# Patient Record
Sex: Male | Born: 1980 | Race: Black or African American | Hispanic: No | Marital: Married | Smoking: Never smoker
Health system: Southern US, Community
[De-identification: ages and names within clinical notes are randomized; demographics above are authoritative.]

## PROBLEM LIST (undated history)

## (undated) DIAGNOSIS — J45909 Unspecified asthma, uncomplicated: Secondary | ICD-10-CM

## (undated) DIAGNOSIS — R569 Unspecified convulsions: Secondary | ICD-10-CM

## (undated) HISTORY — PX: SHOULDER SURGERY: SHX246

---

## 2002-05-23 ENCOUNTER — Ambulatory Visit (HOSPITAL_BASED_OUTPATIENT_CLINIC_OR_DEPARTMENT_OTHER): Admission: RE | Admit: 2002-05-23 | Discharge: 2002-05-24 | Payer: Self-pay | Admitting: Orthopedic Surgery

## 2016-09-28 ENCOUNTER — Emergency Department
Admission: EM | Admit: 2016-09-28 | Discharge: 2016-09-28 | Disposition: A | Discharge status: Home / Self Care | Payer: 59 | Source: Home / Self Care | Attending: Family Medicine | Admitting: Family Medicine

## 2016-09-28 ENCOUNTER — Encounter: Payer: Self-pay | Admitting: *Deleted

## 2016-09-28 DIAGNOSIS — J208 Acute bronchitis due to other specified organisms: Secondary | ICD-10-CM

## 2016-09-28 HISTORY — DX: Unspecified convulsions: R56.9

## 2016-09-28 HISTORY — DX: Unspecified asthma, uncomplicated: J45.909

## 2016-09-28 MED ORDER — AZITHROMYCIN 250 MG PO TABS: 250.0000 mg | ORAL_TABLET | Freq: Every day | ORAL | 0 refills | Status: DC

## 2016-09-28 NOTE — ED Triage Notes (Signed)
Pt c/o nonproductive cough x 6 days. Denies fever.

## 2016-09-28 NOTE — ED Provider Notes (Signed)
CSN: [id number]     Arrival date & time 09/28/16  1607 History   First MD Initiated Contact with Patient 09/28/16 1706     Chief Complaint  Patient presents with  . Cough   (Consider location/radiation/quality/duration/timing/severity/associated sxs/prior Treatment) The history is provided by the patient. No language interpreter was used.  Cough  Cough characteristics:  Non-productive Sputum characteristics:  Nondescript Severity:  Moderate Onset quality:  Gradual Duration:  6 days Timing:  Constant Progression:  Worsening Chronicity:  New Smoker: no   Context: not smoke exposure   Relieved by:  Nothing Worsened by:  Nothing Ineffective treatments:  None tried Associated symptoms: no fever and no shortness of breath   Risk factors: no recent infection   Pt complains of a cough for 6 days.  Pt denies smoking  Past Medical History:  Diagnosis Date  . Asthma   . Seizures (HCC)    Past Surgical History:  Procedure Laterality Date  . SHOULDER SURGERY     Family History  Problem Relation Age of Onset  . Cancer Mother     breast   Social History  Substance Use Topics  . Smoking status: Never Smoker  . Smokeless tobacco: Never Used  . Alcohol use No    Review of Systems  Constitutional: Negative for fever.  Respiratory: Positive for cough. Negative for shortness of breath.   All other systems reviewed and are negative.   Allergies  Review of patient's allergies indicates no known allergies.  Home Medications   Prior to Admission medications   Medication Sig Start Date End Date Taking? Authorizing Provider  UNKNOWN TO PATIENT    Yes Historical Provider, MD  azithromycin (ZITHROMAX) 250 MG tablet Take 1 tablet (250 mg total) by mouth daily. Take first 2 tablets together, then 1 every day until finished. 09/28/16   Elson AreasLeslie K Griffon Herberg, PA-C   Meds Ordered and Administered this Visit  Medications - No data to display  BP (!) 154/105 (BP Location: Left Arm)   Pulse 83    Temp 98.3 F (36.8 C) (Oral)   Resp 16   Ht 6\' 1"  (1.854 m)   Wt 224 lb (101.6 kg)   SpO2 99%   BMI 29.55 kg/m  No data found.   Physical Exam  Constitutional: He appears well-developed and well-nourished.  HENT:  Head: Normocephalic and atraumatic.  Right Ear: External ear normal.  Left Ear: External ear normal.  Mouth/Throat: Oropharynx is clear and moist.  Eyes: Conjunctivae are normal.  Neck: Neck supple.  Cardiovascular: Normal rate and regular rhythm.   No murmur heard. Pulmonary/Chest: Effort normal and breath sounds normal. No respiratory distress.  Abdominal: Soft. There is no tenderness.  Musculoskeletal: He exhibits no edema.  Neurological: He is alert.  Skin: Skin is warm and dry.  Psychiatric: He has a normal mood and affect.  Nursing note and vitals reviewed.   Urgent Care Course   Clinical Course    Procedures (including critical care time)  Labs Review Labs Reviewed - No data to display  Imaging Review No results found.   Visual Acuity Review  Right Eye Distance:   Left Eye Distance:   Bilateral Distance:    Right Eye Near:   Left Eye Near:    Bilateral Near:         MDM   1. Acute bronchitis due to other specified organisms    An After Visit Summary was printed and given to the patient. Meds ordered this encounter  Medications  . UNKNOWN TO PATIENT  . azithromycin (ZITHROMAX) 250 MG tablet    Sig: Take 1 tablet (250 mg total) by mouth daily. Take first 2 tablets together, then 1 every day until finished.    Dispense:  6 tablet    Refill:  0    Order Specific Question:   Supervising Provider    Answer:   Georgina PillionMASSEY, DAVID [[id number]]     Elson AreasLeslie K Oney Folz, PA-C 09/28/16 1953

## 2017-01-08 ENCOUNTER — Emergency Department
Admission: EM | Admit: 2017-01-08 | Discharge: 2017-01-08 | Disposition: A | Discharge status: Home / Self Care | Payer: 59 | Source: Home / Self Care | Attending: Family Medicine | Admitting: Family Medicine

## 2017-01-08 DIAGNOSIS — B9789 Other viral agents as the cause of diseases classified elsewhere: Secondary | ICD-10-CM | POA: Diagnosis not present

## 2017-01-08 DIAGNOSIS — J069 Acute upper respiratory infection, unspecified: Secondary | ICD-10-CM

## 2017-01-08 DIAGNOSIS — J9801 Acute bronchospasm: Secondary | ICD-10-CM

## 2017-01-08 MED ORDER — BENZONATATE 200 MG PO CAPS: ORAL_CAPSULE | ORAL | 0 refills | Status: DC

## 2017-01-08 MED ORDER — PREDNISONE 20 MG PO TABS: ORAL_TABLET | ORAL | 0 refills | Status: DC

## 2017-01-08 MED ORDER — DOXYCYCLINE HYCLATE 100 MG PO CAPS: 100.0000 mg | ORAL_CAPSULE | Freq: Two times a day (BID) | ORAL | 0 refills | Status: DC

## 2017-01-08 NOTE — Discharge Instructions (Signed)
Take plain guaifenesin (1200mg extended release tabs such as Mucinex) twice daily, with plenty of water, for cough and congestion.  May add Pseudoephedrine (30mg, one or two every 4 to 6 hours) for sinus congestion.  Get adequate rest.   °May use Afrin nasal spray (or generic oxymetazoline) twice daily for about 5 days and then discontinue.  Also recommend using saline nasal spray several times daily and saline nasal irrigation (AYR is a common brand).  Use Flonase nasal spray each morning after using Afrin nasal spray and saline nasal irrigation. °Try warm salt water gargles for sore throat.  °Stop all antihistamines for now, and other non-prescription cough/cold preparations. °Continue albuterol inhaler as needed. °  °  °

## 2017-01-08 NOTE — ED Triage Notes (Signed)
Started with sinus pain, congestion, runny nose.  Started coughing up mucous yesterday, yellowish in color.  Coughs to the point of vomiting.

## 2017-01-08 NOTE — ED Provider Notes (Signed)
[facility]    CSN: [id number] Arrival date & time: 01/08/17  1859     History   Chief Complaint Chief Complaint  Patient presents with  . Cough  . Nasal Congestion    HPI Cameron Marks is a 36 y.o. male.   Patient complains of four day history of typical cold-like symptoms developing over several days, including mild sore throat, sinus congestion, fatigue, and cough.  He has a past history of exercise asthma as a child and notes that viral URI's always linger.  He has an albuterol inhaler at home prescribed when he had a URI.   The history is provided by the patient.    Past Medical History:  Diagnosis Date  . Asthma   . Seizures (HCC)     There are no active problems to display for this patient.   Past Surgical History:  Procedure Laterality Date  . SHOULDER SURGERY         Home Medications    Prior to Admission medications   Medication Sig Start Date End Date Taking? Authorizing Provider  carbamazepine (TEGRETOL) 200 MG tablet Take 200 mg by mouth 3 (three) times daily.   Yes Historical Provider, MD  benzonatate (TESSALON) 200 MG capsule Take one cap by mouth at bedtime as needed for cough.  May repeat in 4 to 6 hours 01/08/17   Lattie HawStephen A Beese, MD  doxycycline (VIBRAMYCIN) 100 MG capsule Take 1 capsule (100 mg total) by mouth 2 (two) times daily. Take with food. 01/08/17   Lattie HawStephen A Beese, MD  predniSONE (DELTASONE) 20 MG tablet Take one tab by mouth twice daily for 5 days, then one daily. Take with food. 01/08/17   Lattie HawStephen A Beese, MD  UNKNOWN TO PATIENT     Historical Provider, MD    Family History Family History  Problem Relation Age of Onset  . Cancer Mother     breast    Social History Social History  Substance Use Topics  . Smoking status: Never Smoker  . Smokeless tobacco: Never Used  . Alcohol use No     Allergies   Patient has no known allergies.   Review of Systems Review of Systems + sore throat + cough No  pleuritic pain ? wheezing + nasal congestion + post-nasal drainage No sinus pain/pressure No itchy/red eyes No earache No hemoptysis No SOB No fever, + chills No nausea No vomiting No abdominal pain No diarrhea No urinary symptoms No skin rash + fatigue No myalgias No headache Used OTC meds without relief   Physical Exam Triage Vital Signs ED Triage Vitals  Enc Vitals Group     BP 01/08/17 1921 129/87     Pulse Rate 01/08/17 1921 78     Resp --      Temp 01/08/17 1921 98 F (36.7 C)     Temp Source 01/08/17 1921 Oral     SpO2 01/08/17 1921 96 %     Weight 01/08/17 1921 225 lb (102.1 kg)     Height 01/08/17 1921 6' (1.829 m)     Head Circumference --      Peak Flow --      Pain Score 01/08/17 1923 0     Pain Loc --      Pain Edu? --      Excl. in GC? --    No data found.   Updated Vital Signs BP 129/87 (BP Location: Left Arm)   Pulse 78   Temp 98  F (36.7 C) (Oral)   Ht 6' (1.829 m)   Wt 225 lb (102.1 kg)   SpO2 96%   BMI 30.52 kg/m   Visual Acuity Right Eye Distance:   Left Eye Distance:   Bilateral Distance:    Right Eye Near:   Left Eye Near:    Bilateral Near:     Physical Exam Nursing notes and Vital Signs reviewed. Appearance:  Patient appears stated age, and in no acute distress Eyes:  Pupils are equal, round, and reactive to light and accomodation.  Extraocular movement is intact.  Conjunctivae are not inflamed  Ears:  Canals normal.  Tympanic membranes normal, but may have some serous effusion bilaterally.  Nose:  Congested turbinates.  No sinus tenderness.    Pharynx:  Normal Neck:  Supple.  Tender enlarged posterior/lateral nodes are palpated bilaterally  Lungs:  Course breath sounds posteriorly.  Breath sounds are equal.  Moving air well. Heart:  Regular rate and rhythm without murmurs, rubs, or gallops.  Abdomen:  Nontender without masses or hepatosplenomegaly.  Bowel sounds are present.  No CVA or flank tenderness.  Extremities:   No edema.  Skin:  No rash present.    UC Treatments / Results  Labs (all labs ordered are listed, but only abnormal results are displayed) Labs Reviewed - No data to display  EKG  EKG Interpretation None       Radiology No results found.  Procedures Procedures (including critical care time)  Medications Ordered in UC Medications - No data to display   Initial Impression / Assessment and Plan / UC Course  I have reviewed the triage vital signs and the nursing notes.  Pertinent labs & imaging results that were available during my care of the patient were reviewed by me and considered in my medical decision making (see chart for details).    With patient's history of past asthma, will benefit from prednisone. Begin prednisone burst/taper, and doxycycline 100mg  bid for atypical coverage. Prescription written for Benzonatate [facility]) to take at bedtime for night-time cough.  Take plain guaifenesin (1200mg  extended release tabs such as Mucinex) twice daily, with plenty of water, for cough and congestion.  May add Pseudoephedrine (30mg , one or two every 4 to 6 hours) for sinus congestion.  Get adequate rest.   May use Afrin nasal spray (or generic oxymetazoline) twice daily for about 5 days and then discontinue.  Also recommend using saline nasal spray several times daily and saline nasal irrigation (AYR is a common brand).  Use Flonase nasal spray each morning after using Afrin nasal spray and saline nasal irrigation. Try warm salt water gargles for sore throat.  Stop all antihistamines for now, and other non-prescription cough/cold preparations. Continue albuterol inhaler as needed. Followup with Family Doctor if not improved in one week.      Final Clinical Impressions(s) / UC Diagnoses   Final diagnoses:  Viral URI with cough  Bronchospasm, acute    New Prescriptions New Prescriptions   BENZONATATE (TESSALON) 200 MG CAPSULE    Take one cap by mouth at bedtime as  needed for cough.  May repeat in 4 to 6 hours   DOXYCYCLINE (VIBRAMYCIN) 100 MG CAPSULE    Take 1 capsule (100 mg total) by mouth 2 (two) times daily. Take with food.   PREDNISONE (DELTASONE) 20 MG TABLET    Take one tab by mouth twice daily for 5 days, then one daily. Take with food.     Lattie Haw, MD  01/08/17 2025  

## 2017-10-01 ENCOUNTER — Emergency Department
Admission: EM | Admit: 2017-10-01 | Discharge: 2017-10-01 | Disposition: A | Discharge status: Home / Self Care | Payer: 59 | Source: Home / Self Care | Attending: Emergency Medicine | Admitting: Emergency Medicine

## 2017-10-01 ENCOUNTER — Encounter: Payer: Self-pay | Admitting: Emergency Medicine

## 2017-10-01 DIAGNOSIS — J209 Acute bronchitis, unspecified: Secondary | ICD-10-CM | POA: Diagnosis not present

## 2017-10-01 DIAGNOSIS — J4520 Mild intermittent asthma, uncomplicated: Secondary | ICD-10-CM

## 2017-10-01 MED ORDER — BENZONATATE 100 MG PO CAPS: 100.0000 mg | ORAL_CAPSULE | Freq: Three times a day (TID) | ORAL | 0 refills | Status: DC | PRN

## 2017-10-01 MED ORDER — ALBUTEROL SULFATE HFA 108 (90 BASE) MCG/ACT IN AERS: 1.0000 | INHALATION_SPRAY | Freq: Four times a day (QID) | RESPIRATORY_TRACT | 0 refills | Status: AC | PRN

## 2017-10-01 MED ORDER — IPRATROPIUM-ALBUTEROL 0.5-2.5 (3) MG/3ML IN SOLN
3.0000 mL | Freq: Once | RESPIRATORY_TRACT | Status: AC
  Administered 2017-10-01: 3 mL via RESPIRATORY_TRACT

## 2017-10-01 MED ORDER — AZITHROMYCIN 250 MG PO TABS: ORAL_TABLET | ORAL | 0 refills | Status: DC

## 2017-10-01 NOTE — ED Provider Notes (Signed)
[facility]    CSN: [id number] Arrival date & time: 10/01/17  [id number]     History   Chief Complaint Chief Complaint  Patient presents with  . Cough  Patient states he has been sick the last 3-4 days. Of note he was ill about 2 weeks ago with head and nasal congestion. This resolved with over-the-counter medication after 3-4 days. His most recent illness began a couple of days ago. He started with a dry cough but now has a somewhat productive cough coughing up yellowish phlegm. He has no colored nasal congestion. He has no fever or aching. I note he does have a history of asthma as a child and does keep an inhaler at home.  HPI Cameron Marks is a 36 y.o. male.   HPI  Past Medical History:  Diagnosis Date  . Asthma   . Seizures (HCC)     There are no active problems to display for this patient.   Past Surgical History:  Procedure Laterality Date  . SHOULDER SURGERY         Home Medications    Prior to Admission medications   Medication Sig Start Date End Date Taking? Authorizing Provider  albuterol (PROVENTIL HFA;VENTOLIN HFA) 108 (90 Base) MCG/ACT inhaler Inhale 1-2 puffs every 6 (six) hours as needed into the lungs for wheezing or shortness of breath. 10/01/17   Collene Gobbleaub, Minaal Struckman A, MD  azithromycin (ZITHROMAX) 250 MG tablet Take 2 tabs PO x 1 dose, then 1 tab PO QD x 4 days 10/01/17   Collene Gobbleaub, Shanise Balch A, MD  benzonatate (TESSALON) 100 MG capsule Take 1-2 capsules (100-200 mg total) 3 (three) times daily as needed by mouth for cough. 10/01/17   Collene Gobbleaub, Alira Fretwell A, MD    Family History Family History  Problem Relation Age of Onset  . Cancer Mother        breast    Social History Social History   Tobacco Use  . Smoking status: Never Smoker  . Smokeless tobacco: Never Used  Substance Use Topics  . Alcohol use: No  . Drug use: No     Allergies   Patient has no known allergies.   Review of Systems Review of Systems  Constitutional: Positive for appetite  change. Negative for fever.  HENT: Positive for congestion and postnasal drip.   Respiratory: Positive for cough. Negative for wheezing.      Physical Exam Triage Vital Signs ED Triage Vitals  Enc Vitals Group     BP 10/01/17 0846 110/79     Pulse Rate 10/01/17 0846 90     Resp 10/01/17 0846 16     Temp 10/01/17 0846 98.8 F (37.1 C)     Temp Source 10/01/17 0846 Oral     SpO2 10/01/17 0846 99 %     Weight 10/01/17 0847 216 lb (98 kg)     Height 10/01/17 0847 6\' 1"  (1.854 m)     Head Circumference --      Peak Flow --      Pain Score 10/01/17 0847 0     Pain Loc --      Pain Edu? --      Excl. in GC? --    No data found.  Updated Vital Signs BP 110/79 (BP Location: Left Arm)   Pulse 90   Temp 98.8 F (37.1 C) (Oral)   Resp 16   Ht 6\' 1"  (1.854 m)   Wt 216 lb (98 kg)   SpO2 99%  BMI 28.50 kg/m   Visual Acuity Right Eye Distance:   Left Eye Distance:   Bilateral Distance:    Right Eye Near:   Left Eye Near:    Bilateral Near:     Physical Exam  Constitutional: He appears well-developed and well-nourished.  HENT:  Head: Normocephalic.  Right Ear: External ear normal.  Left Ear: External ear normal.  Mouth/Throat: Oropharynx is clear and moist.  Eyes: EOM are normal. Pupils are equal, round, and reactive to light.  Pulmonary/Chest: He has wheezes.  There are some mild rhonchi right lateral lung. Breath sounds are symmetrical. There is good air exchange.     UC Treatments / Results  Labs (all labs ordered are listed, but only abnormal results are displayed) Labs Reviewed - No data to display  EKG  EKG Interpretation None       Radiology No results found.  Procedures Procedures (including critical care time)  Medications Ordered in UC Medications  ipratropium-albuterol (DUONEB) 0.5-2.5 (3) MG/3ML nebulizer solution 3 mL (3 mLs Nebulization Given 10/01/17 0900)     Initial Impression / Assessment and Plan / UC Course  I have reviewed the  triage vital signs and the nursing notes.  Pertinent labs & imaging results that were available during my care of the patient were reviewed by me and considered in my medical decision making (see chart for details). Patient did seem to improve with his nebulizer treatment. Will treat with Mucinex, albuterol inhaler as well as a Z-Pak.    Final Clinical Impressions(s) / UC Diagnoses   Final diagnoses:  Acute bronchitis, unspecified organism  Mild intermittent reactive airway disease without complication    New Prescriptions This SmartLink is deprecated. Use AVSMEDLIST instead to display the medication list for a patient.   Controlled Substance Prescriptions [place] Controlled Substance Registry consulted? Not Applicable   Collene Gobble, MD 10/01/17 [phone]

## 2017-10-01 NOTE — ED Triage Notes (Signed)
Pt reports that he had a URI that began 2wks ago that was resolving, then he developed a nonproductive cough, nasal congestion and sinus pressure x 4 days. Denies fever.

## 2018-11-28 ENCOUNTER — Encounter: Payer: Self-pay | Admitting: Emergency Medicine

## 2018-11-28 ENCOUNTER — Emergency Department (INDEPENDENT_AMBULATORY_CARE_PROVIDER_SITE_OTHER): Payer: 59

## 2018-11-28 ENCOUNTER — Emergency Department (INDEPENDENT_AMBULATORY_CARE_PROVIDER_SITE_OTHER)
Admission: EM | Admit: 2018-11-28 | Discharge: 2018-11-28 | Disposition: A | Discharge status: Home / Self Care | Payer: 59 | Source: Home / Self Care | Attending: Family Medicine | Admitting: Family Medicine

## 2018-11-28 DIAGNOSIS — M25572 Pain in left ankle and joints of left foot: Secondary | ICD-10-CM

## 2018-11-28 DIAGNOSIS — S93402A Sprain of unspecified ligament of left ankle, initial encounter: Secondary | ICD-10-CM

## 2018-11-28 DIAGNOSIS — R030 Elevated blood-pressure reading, without diagnosis of hypertension: Secondary | ICD-10-CM

## 2018-11-28 NOTE — ED Triage Notes (Signed)
Pt states he was jumping on trampoline Sunday and his left ankle started hurting. Has not improved. Denies swelling or bruising. using ice and motrin.

## 2018-11-28 NOTE — ED Provider Notes (Signed)
[facility]    CSN: [id number] Arrival date & time: 11/28/18  1033     History   Chief Complaint Chief Complaint  Patient presents with  . Ankle Pain    HPI [facility] is a 38 y.o. male.   HPI  Cameron Marks is a 38 y.o. male presenting to UC with c/o Left ankle pain for 5 days after jumping on a trampoline. He has tried ice and motrin with mild relief. Pain is worse with walking. Denies swelling or bruising. No hx of prior injury to same ankle.   BP elevated. Pt states it is only high at the doctor's office.   Past Medical History:  Diagnosis Date  . Asthma   . Seizures (HCC)     There are no active problems to display for this patient.   Past Surgical History:  Procedure Laterality Date  . SHOULDER SURGERY         Home Medications    Prior to Admission medications   Medication Sig Start Date End Date Taking? Authorizing Provider  albuterol (PROVENTIL HFA;VENTOLIN HFA) 108 (90 Base) MCG/ACT inhaler Inhale 1-2 puffs every 6 (six) hours as needed into the lungs for wheezing or shortness of breath. 10/01/17   Collene Gobble, MD  azithromycin (ZITHROMAX) 250 MG tablet Take 2 tabs PO x 1 dose, then 1 tab PO QD x 4 days 10/01/17   Collene Gobble, MD  benzonatate (TESSALON) 100 MG capsule Take 1-2 capsules (100-200 mg total) 3 (three) times daily as needed by mouth for cough. 10/01/17   Collene Gobble, MD    Family History Family History  Problem Relation Age of Onset  . Cancer Mother        breast    Social History Social History   Tobacco Use  . Smoking status: Never Smoker  . Smokeless tobacco: Never Used  Substance Use Topics  . Alcohol use: No  . Drug use: No     Allergies   Patient has no known allergies.   Review of Systems Review of Systems  Musculoskeletal: Positive for arthralgias. Negative for joint swelling and myalgias.  Skin: Negative for color change and wound.  Neurological: Negative for weakness and numbness.      Physical Exam Triage Vital Signs ED Triage Vitals [11/28/18 1104]  Enc Vitals Group     BP (!) 147/108     Pulse Rate 80     Resp      Temp 98 F (36.7 C)     Temp Source Oral     SpO2 98 %     Weight 223 lb (101.2 kg)     Height      Head Circumference      Peak Flow      Pain Score 6     Pain Loc      Pain Edu?      Excl. in GC?    No data found.  Updated Vital Signs BP (!) 152/89 (BP Location: Right Arm)   Pulse 80   Temp 98 F (36.7 C) (Oral)   Wt 223 lb (101.2 kg)   SpO2 98%   BMI 29.42 kg/m   Visual Acuity Right Eye Distance:   Left Eye Distance:   Bilateral Distance:    Right Eye Near:   Left Eye Near:    Bilateral Near:     Physical Exam Vitals signs and nursing note reviewed.  Constitutional:  Appearance: He is well-developed.  HENT:     Head: Normocephalic and atraumatic.  Neck:     Musculoskeletal: Normal range of motion.  Cardiovascular:     Rate and Rhythm: Normal rate.  Pulmonary:     Effort: Pulmonary effort is normal.  Musculoskeletal: Normal range of motion.        General: Tenderness present. No swelling.     Comments: Left ankle: no edema or deformity. Tenderness to dorsal lateral aspect. Full ROM. No crepitus. No foot tenderness.   Skin:    General: Skin is warm and dry.     Capillary Refill: Capillary refill takes less than 2 seconds.     Findings: No abrasion or bruising.  Neurological:     Mental Status: He is alert and oriented to person, place, and time.  Psychiatric:        Behavior: Behavior normal.      UC Treatments / Results  Labs (all labs ordered are listed, but only abnormal results are displayed) Labs Reviewed - No data to display  EKG None  Radiology Dg Ankle Complete Left  Result Date: 11/28/2018 CLINICAL DATA:  Left lateral ankle pain. Trampoline injury 5 days ago. EXAM: LEFT ANKLE COMPLETE - 3+ VIEW COMPARISON:  None. FINDINGS: There is no evidence of fracture, dislocation, or joint  effusion. There is no evidence of arthropathy or other focal bone abnormality. Soft tissues are unremarkable. IMPRESSION: Negative. Electronically Signed   By: Charlett NoseKevin  Dover M.D.   On: 11/28/2018 11:36    Procedures Procedures (including critical care time)  Medications Ordered in UC Medications - No data to display  Initial Impression / Assessment and Plan / UC Course  I have reviewed the triage vital signs and the nursing notes.  Pertinent labs & imaging results that were available during my care of the patient were reviewed by me and considered in my medical decision making (see chart for details).     Reviewed imaging  Hx and exam c/w mild ankle sprain Offered splint, pt declined. Pt states he has a wrap at home Home care info packet provided.  Encouraged to monitor his BP  Final Clinical Impressions(s) / UC Diagnoses   Final diagnoses:  Mild ankle sprain, left, initial encounter     Discharge Instructions      You may take 500mg  acetaminophen every 4-6 hours or in combination with ibuprofen 400-600mg  every 6-8 hours as needed for pain and inflammation.  Please call to schedule an appointment with sports medicine in 1 week if not improving.      ED Prescriptions    None     Controlled Substance Prescriptions [place] Controlled Substance Registry consulted? Not Applicable   Rolla Platehelps, Sherilee Smotherman O, PA-C 11/28/18 1152

## 2018-11-28 NOTE — Discharge Instructions (Signed)
°  You may take 500mg  acetaminophen every 4-6 hours or in combination with ibuprofen 400-600mg  every 6-8 hours as needed for pain and inflammation.  Please call to schedule an appointment with sports medicine in 1 week if not improving.

## 2019-10-12 IMAGING — DX DG ANKLE COMPLETE 3+V*L*
3 series · 3 of 3 positions shown · non-contrast
Comparison: None.

CLINICAL DATA: Left lateral ankle pain. Trampoline injury 5 days
ago.

EXAM:
LEFT ANKLE COMPLETE - 3+ VIEW

[ankle ap]
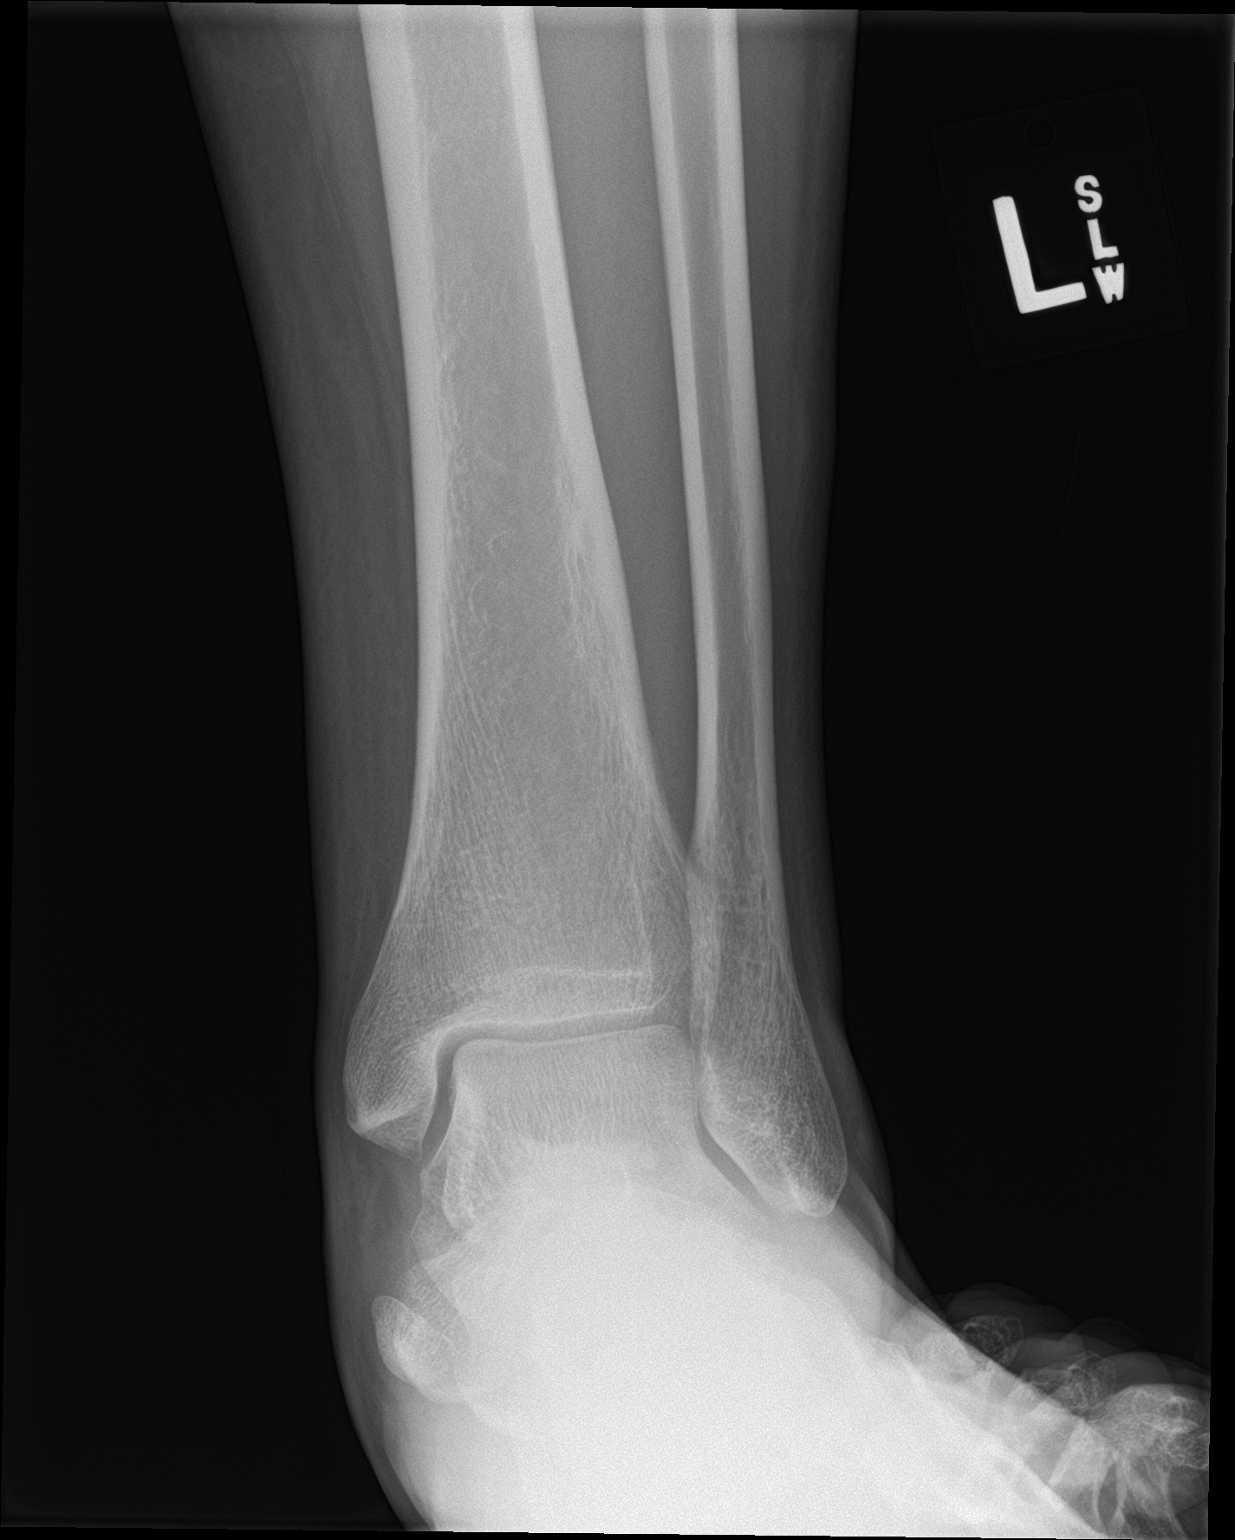

[ankle obl]
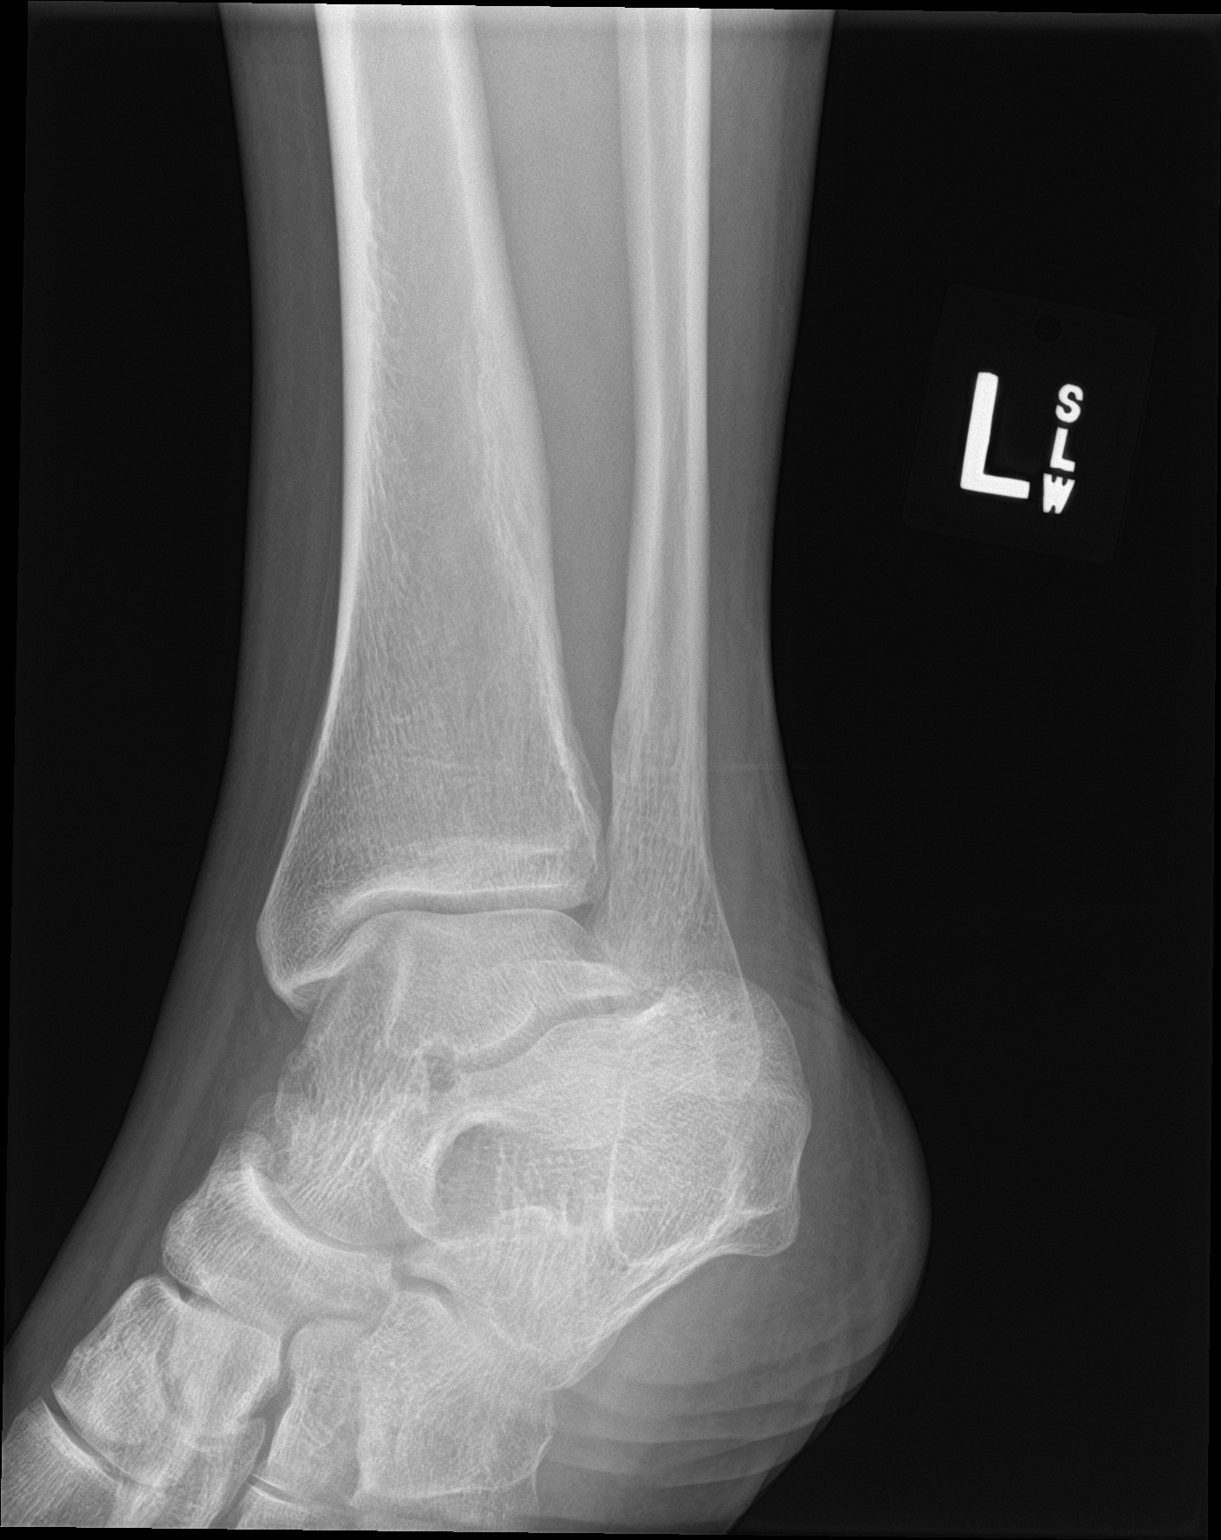

[ankle lat]
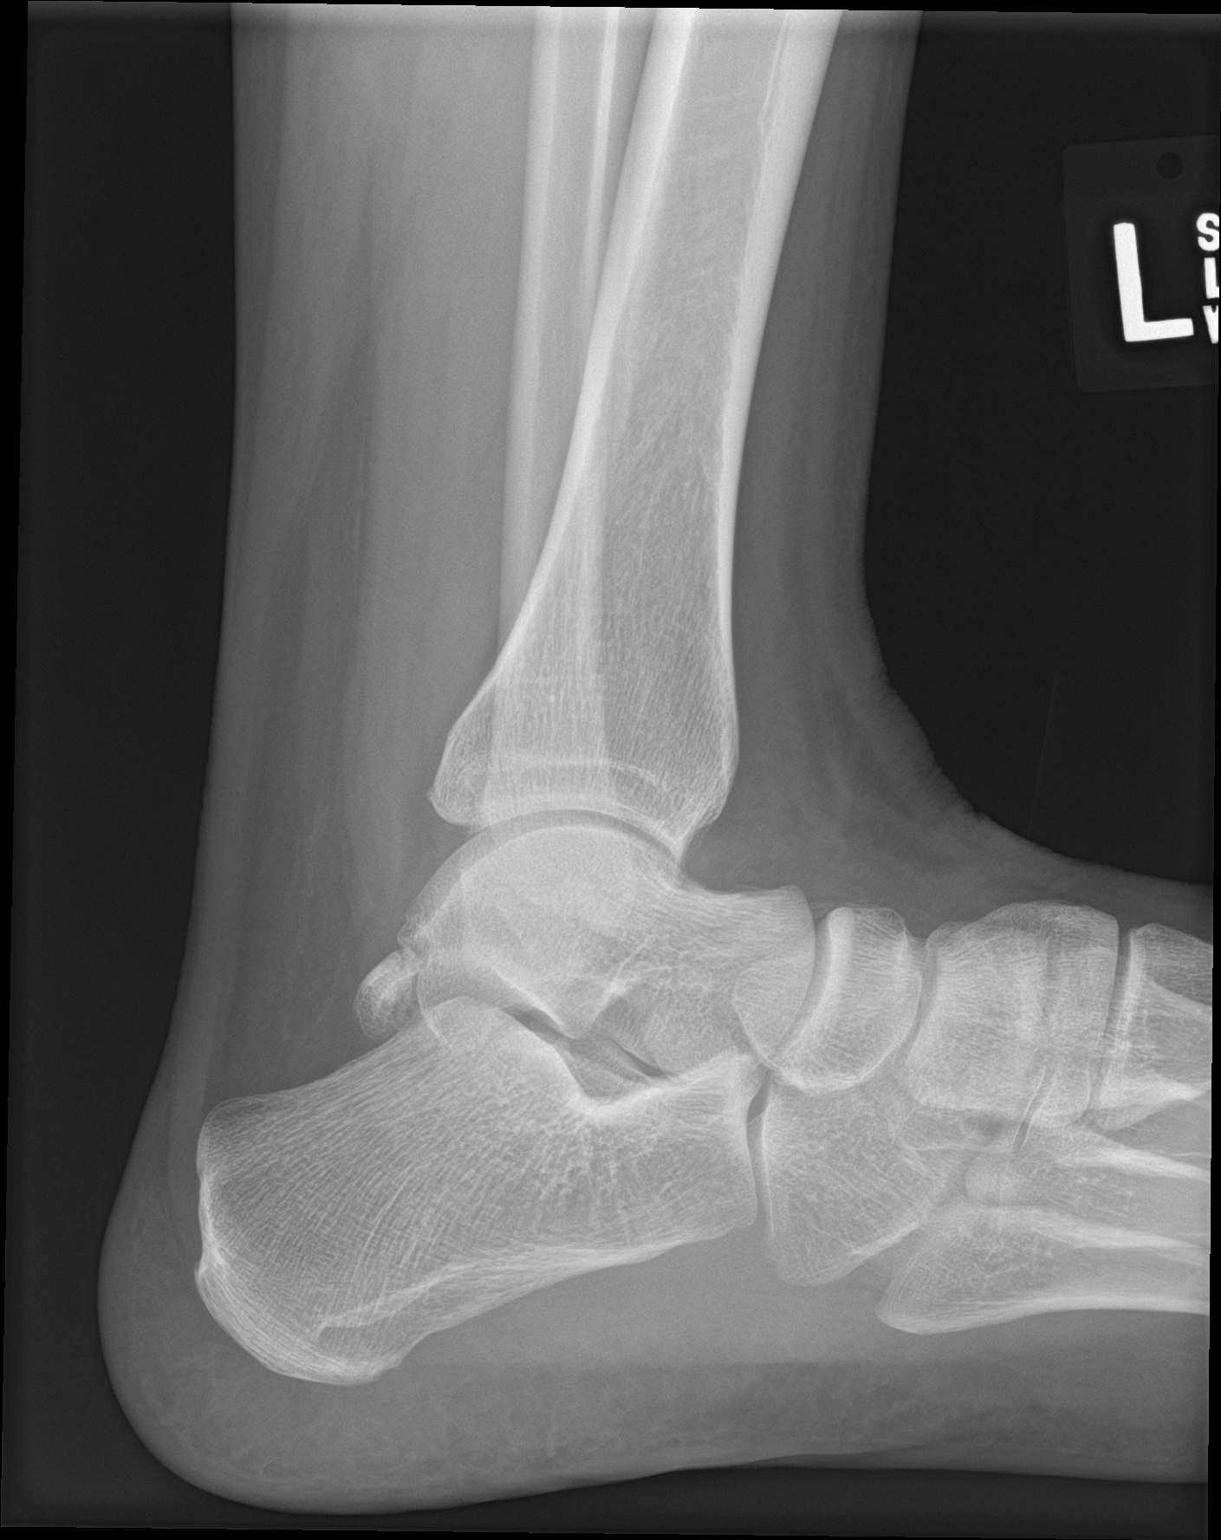

[3 of 3 positions shown; findings below may reference images not displayed]

FINDINGS: There is no evidence of fracture, dislocation, or joint effusion.
There is no evidence of arthropathy or other focal bone abnormality.
Soft tissues are unremarkable.
IMPRESSION: Negative.

## 2019-10-26 ENCOUNTER — Telehealth: Payer: 59 | Admitting: Family

## 2019-10-26 DIAGNOSIS — J069 Acute upper respiratory infection, unspecified: Secondary | ICD-10-CM | POA: Diagnosis not present

## 2019-10-26 MED ORDER — PROMETHAZINE-DM 6.25-15 MG/5ML PO SYRP: 5.0000 mL | ORAL_SOLUTION | Freq: Four times a day (QID) | ORAL | 0 refills | Status: DC | PRN

## 2019-10-26 MED ORDER — FLUTICASONE PROPIONATE 50 MCG/ACT NA SUSP: 2.0000 | Freq: Every day | NASAL | 0 refills | Status: AC

## 2019-10-26 NOTE — Progress Notes (Signed)
We are sorry you are not feeling well.  Here is how we plan to help!  Based on what you have shared with me, it looks like you may have a viral upper respiratory infection.  Upper respiratory infections are caused by a large number of viruses; however, rhinovirus is the most common cause.   Symptoms vary from person to person, with common symptoms including sore throat, cough, fatigue or lack of energy and feeling of general discomfort.  A low-grade fever of up to 100.4 may present, but is often uncommon.  Symptoms vary however, and are closely related to a person's age or underlying illnesses.  The most common symptoms associated with an upper respiratory infection are nasal discharge or congestion, cough, sneezing, headache and pressure in the ears and face.  These symptoms usually persist for about 3 to 10 days, but can last up to 2 weeks.  It is important to know that upper respiratory infections do not cause serious illness or complications in most cases.    Upper respiratory infections can be transmitted from person to person, with the most common method of transmission being a person's hands.  The virus is able to live on the skin and can infect other persons for up to 2 hours after direct contact.  Also, these can be transmitted when someone coughs or sneezes; thus, it is important to cover the mouth to reduce this risk.  To keep the spread of the illness at [place], good hand hygiene is very important.  This is an infection that is most likely caused by a virus. There are no specific treatments other than to help you with the symptoms until the infection runs its course.  We are sorry you are not feeling well.  Here is how we plan to help!   For nasal congestion, you may use an oral decongestants such as Mucinex D or if you have glaucoma or high blood pressure use plain Mucinex.  Saline nasal spray or nasal drops can help and can safely be used as often as needed for congestion.  For your congestion,  I have prescribed Fluticasone nasal spray one spray in each nostril twice a day  If you do not have a history of heart disease, hypertension, diabetes or thyroid disease, prostate/bladder issues or glaucoma, you may also use Sudafed to treat nasal congestion.  It is highly recommended that you consult with a pharmacist or your primary care physician to ensure this medication is safe for you to take.     If you have a cough, you may use cough suppressants such as Delsym and Robitussin.  If you have glaucoma or high blood pressure, you can also use Coricidin HBP.   For cough I have prescribed for you Promethazine cough medication that will help with your symptoms  If you have a sore or scratchy throat, use a saltwater gargle-  to  teaspoon of salt dissolved in a 4-ounce to 8-ounce glass of warm water.  Gargle the solution for approximately 15-30 seconds and then spit.  It is important not to swallow the solution.  You can also use throat lozenges/cough drops and Chloraseptic spray to help with throat pain or discomfort.  Warm or cold liquids can also be helpful in relieving throat pain.  For headache, pain or general discomfort, you can use Ibuprofen or Tylenol as directed.   Some authorities believe that zinc sprays or the use of Echinacea may shorten the course of your symptoms.   HOME CARE .  Only take medications as instructed by your medical team. . Be sure to drink plenty of fluids. Water is fine as well as fruit juices, sodas and electrolyte beverages. You may want to stay away from caffeine or alcohol. If you are nauseated, try taking small sips of liquids. How do you know if you are getting enough fluid? Your urine should be a pale yellow or almost colorless. . Get rest. . Taking a steamy shower or using a humidifier may help nasal congestion and ease sore throat pain. You can place a towel over your head and breathe in the steam from hot water coming from a faucet. . Using a saline nasal  spray works much the same way. . Cough drops, hard candies and sore throat lozenges may ease your cough. . Avoid close contacts especially the very young and the elderly . Cover your mouth if you cough or sneeze . Always remember to wash your hands.   GET HELP RIGHT AWAY IF: . You develop worsening fever. . If your symptoms do not improve within 10 days . You develop yellow or green discharge from your nose over 3 days. . You have coughing fits . You develop a severe head ache or visual changes. . You develop shortness of breath, difficulty breathing or start having chest pain . Your symptoms persist after you have completed your treatment plan  MAKE SURE YOU   Understand these instructions.  Will watch your condition.  Will get help right away if you are not doing well or get worse.  Your e-visit answers were reviewed by a board certified advanced clinical practitioner to complete your personal care plan. Depending upon the condition, your plan could have included both over the counter or prescription medications. Please review your pharmacy choice. If there is a problem, you may call our nursing hot line at and have the prescription routed to another pharmacy. Your safety is important to [place]. If you have drug allergies check your prescription carefully.   You can use MyChart to ask questions about today's visit, request a non-urgent call back, or ask for  a work or school excuse for 24 hours related to this e-Visit. If it has been greater than 24 hours you will need to follow up with your provider, or enter a new e-Visit to address those concerns. You will get an e-mail in the next two days asking about your experience.  I hope that your e-visit has been valuable and will speed your recovery. Thank you for using e-visits.  g Greater than 5 minutes, yet less than 10 minutes of time have been spent researching, coordinating, and implementing care for this patient today.  Thank you for  the details you included in the comment boxes. Those details are very helpful in determining the best course of treatment for you and help [place] to provide the best care.

## 2019-11-15 ENCOUNTER — Other Ambulatory Visit: Payer: Self-pay | Admitting: Family

## 2024-03-06 ENCOUNTER — Ambulatory Visit: Admission: EM | Admit: 2024-03-06 | Discharge: 2024-03-06 | Disposition: A | Discharge status: Home / Self Care

## 2024-03-06 DIAGNOSIS — G4486 Cervicogenic headache: Secondary | ICD-10-CM | POA: Diagnosis not present

## 2024-03-06 DIAGNOSIS — R110 Nausea: Secondary | ICD-10-CM | POA: Diagnosis not present

## 2024-03-06 DIAGNOSIS — M47812 Spondylosis without myelopathy or radiculopathy, cervical region: Secondary | ICD-10-CM | POA: Diagnosis not present

## 2024-03-06 MED ORDER — ONDANSETRON 8 MG PO TBDP
8.0000 mg | ORAL_TABLET | Freq: Once | ORAL | Status: AC
  Administered 2024-03-06: 8 mg via ORAL

## 2024-03-06 MED ORDER — ONDANSETRON 8 MG PO TBDP: 8.0000 mg | ORAL_TABLET | Freq: Three times a day (TID) | ORAL | 0 refills | Status: AC | PRN

## 2024-03-06 MED ORDER — HYDROCODONE-ACETAMINOPHEN 5-325 MG PO TABS: 1.0000 | ORAL_TABLET | Freq: Three times a day (TID) | ORAL | 0 refills | Status: AC | PRN

## 2024-03-06 MED ORDER — KETOROLAC TROMETHAMINE 60 MG/2ML IM SOLN
60.0000 mg | Freq: Once | INTRAMUSCULAR | Status: AC
  Administered 2024-03-06: 60 mg via INTRAMUSCULAR

## 2024-03-06 MED ORDER — CELECOXIB 200 MG PO CAPS: 200.0000 mg | ORAL_CAPSULE | Freq: Every day | ORAL | 0 refills | Status: AC

## 2024-03-06 NOTE — ED Provider Notes (Signed)
 [facility]    CSN: [id number] Arrival date & time: 03/06/24  1232      History   Chief Complaint Chief Complaint  Patient presents with   Headache   Neck Pain    HPI Cameron Marks is a 43 y.o. male.   HPI 43 year old male presents with headache, neck pain, and back pain.  PMH significant for obesity, seizures, cerebral cavernoma and cervical spondylosis.  Patient is accompanied by his wife this afternoon.  Past Medical History:  Diagnosis Date   Asthma    Seizures (HCC)     There are no active problems to display for this patient.   Past Surgical History:  Procedure Laterality Date   SHOULDER SURGERY     SHOULDER SURGERY         Home Medications    Prior to Admission medications   Medication Sig Start Date End Date Taking? Authorizing Provider  celecoxib (CELEBREX) 200 MG capsule Take 1 capsule (200 mg total) by mouth daily for 15 days. 03/06/24 03/21/24 Yes Trevor Iha, FNP  HYDROcodone-acetaminophen (NORCO/VICODIN) 5-325 MG tablet Take 1 tablet by mouth every 8 (eight) hours as needed for up to 7 days. 03/06/24 03/13/24 Yes Trevor Iha, FNP  levETIRAcetam (KEPPRA) 500 MG tablet Take by mouth. 10/11/20  Yes [provider]  ondansetron (ZOFRAN-ODT) 8 MG disintegrating tablet Take 1 tablet (8 mg total) by mouth every 8 (eight) hours as needed for nausea or vomiting. 03/06/24  Yes Trevor Iha, FNP  albuterol (PROVENTIL HFA;VENTOLIN HFA) 108 (90 Base) MCG/ACT inhaler Inhale 1-2 puffs every 6 (six) hours as needed into the lungs for wheezing or shortness of breath. 10/01/17   Collene Gobble, MD  fluticasone (FLONASE) 50 MCG/ACT nasal spray Place 2 sprays into both nostrils daily. 10/26/19   Worthy Rancher B, FNP  pantoprazole (PROTONIX) 20 MG tablet Take 20 mg by mouth daily.    [provider]    Family History Family History  Problem Relation Age of Onset   Cancer Mother        breast    Social History Social History    Tobacco Use   Smoking status: Never   Smokeless tobacco: Never  Vaping Use   Vaping status: Never Used  Substance Use Topics   Alcohol use: No   Drug use: No     Allergies   Penicillins   Review of Systems Review of Systems  Musculoskeletal:  Positive for neck pain.  Neurological:  Positive for headaches.  All other systems reviewed and are negative.    Physical Exam Triage Vital Signs ED Triage Vitals  Encounter Vitals Group     BP      Systolic BP Percentile      Diastolic BP Percentile      Pulse      Resp      Temp      Temp src      SpO2      Weight      Height      Head Circumference      Peak Flow      Pain Score      Pain Loc      Pain Education      Exclude from Growth Chart    No data found.  Updated Vital Signs BP 138/88 (BP Location: Right Arm)   Pulse 88   Temp 99.6 F (37.6 C) (Oral)   Resp 17   SpO2 99%  Physical Exam Vitals and nursing note reviewed.  Constitutional:      Appearance: Normal appearance. He is normal weight.  HENT:     Head: Normocephalic and atraumatic.     Mouth/Throat:     Mouth: Mucous membranes are moist.     Pharynx: Oropharynx is clear.  Eyes:     Extraocular Movements: Extraocular movements intact.     Conjunctiva/sclera: Conjunctivae normal.     Pupils: Pupils are equal, round, and reactive to light.  Cardiovascular:     Rate and Rhythm: Normal rate and regular rhythm.     Pulses: Normal pulses.     Heart sounds: Normal heart sounds.  Pulmonary:     Effort: Pulmonary effort is normal.     Breath sounds: Normal breath sounds. No wheezing, rhonchi or rales.  Musculoskeletal:        General: Normal range of motion.     Cervical back: Normal range of motion and neck supple.  Skin:    General: Skin is warm and dry.  Neurological:     General: No focal deficit present.     Mental Status: He is alert and oriented to person, place, and time. Mental status is at baseline.  Psychiatric:        Mood  and Affect: Mood normal.        Behavior: Behavior normal.        Thought Content: Thought content normal.      UC Treatments / Results  Labs (all labs ordered are listed, but only abnormal results are displayed) Labs Reviewed - No data to display  EKG   Radiology No results found.  Procedures Procedures (including critical care time)  Medications Ordered in UC Medications  ketorolac (TORADOL) injection 60 mg (60 mg Intramuscular Given 03/06/24 1325)  ondansetron (ZOFRAN-ODT) disintegrating tablet 8 mg (8 mg Oral Given 03/06/24 1321)    Initial Impression / Assessment and Plan / UC Course  I have reviewed the triage vital signs and the nursing notes.  Pertinent labs & imaging results that were available during my care of the patient were reviewed by me and considered in my medical decision making (see chart for details).     MDM: 1.  Cervicogenic headache-IM Toradol 60 mg given once in clinic and prior to discharge; 2.  Nausea-Zofran 8 mg disintegrating tablet given once in clinic prior to discharge, Rx'd Zofran 8 mg disintegrating tablet: Take 1 tablet every 8 hours as needed for nausea and vomiting; 3.  Cervical spondylosis without myelopathy-please see cervical spine x-ray results from 07/04/2023.  Rx'd Norco 5/325 mg tablet: Take 1 tablet every 8 hours as needed for severe/acute neck pain. Patient to take medications as directed with food to completion.  Advised may take Zofran daily or as needed for nausea.  Advised patient may take Norco daily as needed for acute breakthrough neck pain.  Patient advised of sedative effects of this medication.  Encouraged to increase daily water intake to 64 ounces per day while taking these medications.  Advised if symptoms worsen and/or unresolved please follow-up with your PCP or here for further evaluation. Final Clinical Impressions(s) / UC Diagnoses   Final diagnoses:  Cervicogenic headache  Nausea  Cervical spondylosis without  myelopathy     Discharge Instructions      Patient to take medications as directed with food to completion.  Advised may take Zofran daily or as needed for nausea.  Advised patient may take Norco daily as needed for acute breakthrough  neck pain.  Patient advised of sedative effects of this medication.  Encouraged to increase daily water intake to 64 ounces per day while taking these medications.  Advised if symptoms worsen and/or unresolved please follow-up with your PCP or here for further evaluation.     ED Prescriptions     Medication Sig Dispense Auth. Provider   celecoxib (CELEBREX) 200 MG capsule Take 1 capsule (200 mg total) by mouth daily for 15 days. 15 capsule Trevor Iha, FNP   ondansetron (ZOFRAN-ODT) 8 MG disintegrating tablet Take 1 tablet (8 mg total) by mouth every 8 (eight) hours as needed for nausea or vomiting. 24 tablet Trevor Iha, FNP   HYDROcodone-acetaminophen (NORCO/VICODIN) 5-325 MG tablet Take 1 tablet by mouth every 8 (eight) hours as needed for up to 7 days. 21 tablet Trevor Iha, FNP      I have reviewed the PDMP during this encounter.   Trevor Iha, FNP 03/06/24 1332

## 2024-03-06 NOTE — ED Triage Notes (Signed)
 Pt c/o HA and neck pain since Mon. Tylenol and aleve prn with no relief. Neck xray last year at [facility]

## 2024-03-06 NOTE — Discharge Instructions (Addendum)
 Patient to take medications as directed with food to completion.  Advised may take Zofran daily or as needed for nausea.  Advised patient may take Norco daily as needed for acute breakthrough neck pain.  Patient advised of sedative effects of this medication.  Encouraged to increase daily water intake to 64 ounces per day while taking these medications.  Advised if symptoms worsen and/or unresolved please follow-up with your PCP or here for further evaluation.
